# Patient Record
Sex: Female | Born: 1960 | Race: White | Hispanic: No | Marital: Married | Smoking: Never smoker
Health system: Southern US, Community
[De-identification: ages and names within clinical notes are randomized; demographics above are authoritative.]

## PROBLEM LIST (undated history)

## (undated) DIAGNOSIS — R4586 Emotional lability: Secondary | ICD-10-CM

## (undated) DIAGNOSIS — F329 Major depressive disorder, single episode, unspecified: Secondary | ICD-10-CM

## (undated) DIAGNOSIS — K633 Ulcer of intestine: Secondary | ICD-10-CM

## (undated) DIAGNOSIS — D126 Benign neoplasm of colon, unspecified: Secondary | ICD-10-CM

## (undated) DIAGNOSIS — F32A Depression, unspecified: Secondary | ICD-10-CM

## (undated) HISTORY — DX: Major depressive disorder, single episode, unspecified: F32.9

## (undated) HISTORY — DX: Depression, unspecified: F32.A

## (undated) HISTORY — DX: Ulcer of intestine: K63.3

## (undated) HISTORY — DX: Benign neoplasm of colon, unspecified: D12.6

## (undated) HISTORY — PX: FRACTURE SURGERY: SHX138

---

## 2000-11-11 ENCOUNTER — Inpatient Hospital Stay (HOSPITAL_COMMUNITY): Admission: AD | Admit: 2000-11-11 | Discharge: 2000-11-13 | Payer: Self-pay | Admitting: *Deleted

## 2000-12-27 ENCOUNTER — Other Ambulatory Visit: Admission: RE | Admit: 2000-12-27 | Discharge: 2000-12-27 | Payer: Self-pay | Admitting: *Deleted

## 2001-09-19 ENCOUNTER — Encounter: Admission: RE | Admit: 2001-09-19 | Discharge: 2001-12-18 | Payer: Self-pay | Admitting: Internal Medicine

## 2003-02-23 ENCOUNTER — Other Ambulatory Visit: Admission: RE | Admit: 2003-02-23 | Discharge: 2003-02-23 | Payer: Self-pay | Admitting: *Deleted

## 2005-11-29 ENCOUNTER — Other Ambulatory Visit: Admission: RE | Admit: 2005-11-29 | Discharge: 2005-11-29 | Payer: Self-pay | Admitting: Internal Medicine

## 2006-07-23 ENCOUNTER — Encounter: Admission: RE | Admit: 2006-07-23 | Discharge: 2006-07-23 | Payer: Self-pay | Admitting: *Deleted

## 2007-07-25 ENCOUNTER — Encounter: Admission: RE | Admit: 2007-07-25 | Discharge: 2007-07-25 | Payer: Self-pay | Admitting: *Deleted

## 2008-03-05 ENCOUNTER — Encounter: Admission: RE | Admit: 2008-03-05 | Discharge: 2008-03-05 | Payer: Self-pay | Admitting: Internal Medicine

## 2008-08-05 ENCOUNTER — Encounter: Admission: RE | Admit: 2008-08-05 | Discharge: 2008-08-05 | Payer: Self-pay | Admitting: Internal Medicine

## 2009-08-06 ENCOUNTER — Encounter: Admission: RE | Admit: 2009-08-06 | Discharge: 2009-08-06 | Payer: Self-pay | Admitting: Internal Medicine

## 2010-07-30 ENCOUNTER — Other Ambulatory Visit: Payer: Self-pay | Admitting: Internal Medicine

## 2010-07-30 DIAGNOSIS — Z1239 Encounter for other screening for malignant neoplasm of breast: Secondary | ICD-10-CM

## 2010-08-15 ENCOUNTER — Ambulatory Visit
Admission: RE | Admit: 2010-08-15 | Discharge: 2010-08-15 | Disposition: A | Discharge status: Home / Self Care | Payer: BC Managed Care – PPO | Source: Ambulatory Visit | Attending: Internal Medicine | Admitting: Internal Medicine

## 2010-08-15 DIAGNOSIS — Z1239 Encounter for other screening for malignant neoplasm of breast: Secondary | ICD-10-CM

## 2011-07-20 ENCOUNTER — Other Ambulatory Visit: Payer: Self-pay | Admitting: Family Medicine

## 2011-07-20 ENCOUNTER — Other Ambulatory Visit: Payer: Self-pay | Admitting: Physician Assistant

## 2011-07-20 DIAGNOSIS — Z1231 Encounter for screening mammogram for malignant neoplasm of breast: Secondary | ICD-10-CM

## 2011-08-23 ENCOUNTER — Ambulatory Visit
Admission: RE | Admit: 2011-08-23 | Discharge: 2011-08-23 | Disposition: A | Discharge status: Home / Self Care | Payer: BC Managed Care – PPO | Source: Ambulatory Visit | Attending: Family Medicine | Admitting: Family Medicine

## 2011-08-23 ENCOUNTER — Ambulatory Visit: Payer: BC Managed Care – PPO

## 2011-08-23 DIAGNOSIS — Z1231 Encounter for screening mammogram for malignant neoplasm of breast: Secondary | ICD-10-CM

## 2012-07-23 ENCOUNTER — Other Ambulatory Visit: Payer: Self-pay | Admitting: Family Medicine

## 2012-07-23 DIAGNOSIS — Z1231 Encounter for screening mammogram for malignant neoplasm of breast: Secondary | ICD-10-CM

## 2012-08-28 ENCOUNTER — Ambulatory Visit
Admission: RE | Admit: 2012-08-28 | Discharge: 2012-08-28 | Disposition: A | Discharge status: Home / Self Care | Payer: BC Managed Care – PPO | Source: Ambulatory Visit | Attending: Family Medicine | Admitting: Family Medicine

## 2012-12-28 ENCOUNTER — Emergency Department (HOSPITAL_COMMUNITY): Payer: BC Managed Care – PPO

## 2012-12-28 ENCOUNTER — Encounter (HOSPITAL_COMMUNITY): Payer: Self-pay | Admitting: *Deleted

## 2012-12-28 ENCOUNTER — Emergency Department (HOSPITAL_COMMUNITY)
Admission: EM | Admit: 2012-12-28 | Discharge: 2012-12-28 | Disposition: A | Discharge status: Home / Self Care | Payer: BC Managed Care – PPO | Attending: Emergency Medicine | Admitting: Emergency Medicine

## 2012-12-28 DIAGNOSIS — M533 Sacrococcygeal disorders, not elsewhere classified: Secondary | ICD-10-CM

## 2012-12-28 DIAGNOSIS — Z3202 Encounter for pregnancy test, result negative: Secondary | ICD-10-CM | POA: Insufficient documentation

## 2012-12-28 DIAGNOSIS — R110 Nausea: Secondary | ICD-10-CM | POA: Insufficient documentation

## 2012-12-28 DIAGNOSIS — M5430 Sciatica, unspecified side: Secondary | ICD-10-CM | POA: Insufficient documentation

## 2012-12-28 DIAGNOSIS — Z79899 Other long term (current) drug therapy: Secondary | ICD-10-CM | POA: Insufficient documentation

## 2012-12-28 HISTORY — DX: Emotional lability: R45.86

## 2012-12-28 LAB — COMPREHENSIVE METABOLIC PANEL
BUN: 14 mg/dL (ref 6–23)
CO2: 25 mEq/L (ref 19–32)
Calcium: 8.9 mg/dL (ref 8.4–10.5)
Creatinine, Ser: 0.79 mg/dL (ref 0.50–1.10)
GFR calc Af Amer: >90 mL/min (ref >90)
GFR calc non Af Amer: >90 mL/min (ref >90)
Glucose, Bld: 92 mg/dL (ref 70–99)

## 2012-12-28 LAB — URINALYSIS, ROUTINE W REFLEX MICROSCOPIC
Bilirubin Urine: NEGATIVE
Nitrite: NEGATIVE
Specific Gravity, Urine: 1.031 — ABNORMAL HIGH (ref 1.005–1.030)
Urobilinogen, UA: 0.2 mg/dL (ref 0.0–1.0)

## 2012-12-28 LAB — CBC WITH DIFFERENTIAL/PLATELET
Eosinophils Relative: 1 % (ref 0–5)
HCT: 36.2 % (ref 36.0–46.0)
Lymphocytes Relative: 13 % (ref 12–46)
Lymphs Abs: 1.1 10*3/uL (ref 0.7–4.0)
MCV: 83.2 fL (ref 78.0–100.0)
Monocytes Absolute: 0.6 10*3/uL (ref 0.1–1.0)
RBC: 4.35 MIL/uL (ref 3.87–5.11)
WBC: 8.7 10*3/uL (ref 4.0–10.5)

## 2012-12-28 LAB — URINE MICROSCOPIC-ADD ON

## 2012-12-28 LAB — POCT PREGNANCY, URINE: Preg Test, Ur: NEGATIVE

## 2012-12-28 MED ORDER — FENTANYL CITRATE 0.05 MG/ML IJ SOLN
50.0000 ug | Freq: Once | INTRAMUSCULAR | Status: AC
  Administered 2012-12-28: 50 ug via INTRAVENOUS
  Filled 2012-12-28: qty 2

## 2012-12-28 MED ORDER — NAPROXEN SODIUM 220 MG PO TABS: ORAL_TABLET | ORAL | Status: DC

## 2012-12-28 MED ORDER — KETOROLAC TROMETHAMINE 15 MG/ML IJ SOLN
15.0000 mg | Freq: Once | INTRAMUSCULAR | Status: AC
  Administered 2012-12-28: 15 mg via INTRAVENOUS
  Filled 2012-12-28: qty 1

## 2012-12-28 MED ORDER — OXYCODONE-ACETAMINOPHEN 5-325 MG PO TABS: 1.0000 | ORAL_TABLET | ORAL | Status: DC | PRN

## 2012-12-28 NOTE — ED Provider Notes (Addendum)
History     CSN: [id number]  Arrival date & time 12/28/12  4098   First MD Initiated Contact with Patient 12/28/12 0534      Chief Complaint  Patient presents with  . Flank Pain    (Consider location/radiation/quality/duration/timing/severity/associated sxs/prior treatment) HPI This is a 52 year old female with several days of pain in her right flank. Specifically she points to her right sacroiliac region. The pain sometimes radiates around to her right groin. She was seen prior primary care physician 2 days ago and started on hydrocodone and Skelaxin without relief. The pain is worse with movement. She has had nausea with it but no vomiting. She states the pain is severe at its worst. She has no history of nephrolithiasis. 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  Past Medical History  Diagnosis Date  . Mood change     No past surgical history on file.  History reviewed. No pertinent family history.  History  Substance Use Topics  . Smoking status: Never Smoker   . Smokeless tobacco: Not on file  . Alcohol Use: No    OB History   Grav Para Term Preterm Abortions TAB SAB Ect Mult Living                  Review of Systems  All other systems reviewed and are negative.    Allergies  Sulfa antibiotics  Home Medications   Current Outpatient Rx  Name  Route  Sig  Dispense  Refill  . Ferrous Sulfate (IRON) 28 MG TABS   Oral   Take 1 tablet by mouth daily.         Marland Kitchen FLUoxetine (PROZAC) 20 MG capsule   Oral   Take 20 mg by mouth daily.         Marland Kitchen HYDROcodone-acetaminophen (NORCO/VICODIN) 5-325 MG per tablet   Oral   Take 1 tablet by mouth every 6 (six) hours as needed for pain (pain).         Marland Kitchen ibuprofen (ADVIL,MOTRIN) 200 MG tablet   Oral   Take 800 mg by mouth every 6 (six) hours as needed for pain (pain).         . metaxalone (SKELAXIN) 800 MG tablet   Oral   Take 800 mg by mouth 3 (three) times daily.           BP 116/55  Pulse 75   Temp(Src) 98.1 F (36.7 C) (Oral)  Resp 20  Wt 138 lb (62.596 kg)  SpO2 99%  LMP 12/20/2012  Physical Exam General: Well-developed, well-nourished female in no acute distress; appearance consistent with age of record HENT: normocephalic, atraumatic Eyes: pupils equal round and reactive to light; extraocular muscles intact Neck: supple Heart: regular rate and rhythm Lungs: clear to auscultation bilaterally Abdomen: soft; nondistended; nontender; no masses or hepatosplenomegaly; bowel sounds present GU: No CVA tenderness Back: No sacroiliac tenderness; negative straight leg raise bilaterally Extremities: No deformity; full range of motion; pulses normal Neurologic: Awake, alert and oriented; motor function intact in all extremities and symmetric; no facial droop Skin: Warm and dry Psychiatric: Normal mood and affect    ED Course  Procedures (including critical care time)     MDM   Nursing notes and vitals signs, including pulse oximetry, reviewed.  Summary of this visit's results, reviewed by myself:  Labs:  Results for orders placed during the hospital encounter of 12/28/12 (from the past 24 hour(s))  CBC WITH DIFFERENTIAL     Status: Abnormal  Collection Time    12/28/12  3:37 AM      Result Value Range   WBC 8.7  4.0 - 10.5 K/uL   RBC 4.35  3.87 - 5.11 MIL/uL   Hemoglobin 12.2  12.0 - 15.0 g/dL   HCT 16.1  09.6 - 04.5 %   MCV 83.2  78.0 - 100.0 fL   MCH 28.0  26.0 - 34.0 pg   MCHC 33.7  30.0 - 36.0 g/dL   RDW 40.9  81.1 - 91.4 %   Platelets 193  150 - 400 K/uL   Neutrophils Relative % 80 (*) 43 - 77 %   Neutro Abs 7.0  1.7 - 7.7 K/uL   Lymphocytes Relative 13  12 - 46 %   Lymphs Abs 1.1  0.7 - 4.0 K/uL   Monocytes Relative 6  3 - 12 %   Monocytes Absolute 0.6  0.1 - 1.0 K/uL   Eosinophils Relative 1  0 - 5 %   Eosinophils Absolute 0.1  0.0 - 0.7 K/uL   Basophils Relative 1  0 - 1 %   Basophils Absolute 0.0  0.0 - 0.1 K/uL  COMPREHENSIVE METABOLIC PANEL      Status: Abnormal   Collection Time    12/28/12  3:37 AM      Result Value Range   Sodium 133 (*) 135 - 145 mEq/L   Potassium 3.9  3.5 - 5.1 mEq/L   Chloride 100  96 - 112 mEq/L   CO2 25  19 - 32 mEq/L   Glucose, Bld 92  70 - 99 mg/dL   BUN 14  6 - 23 mg/dL   Creatinine, Ser 7.82  0.50 - 1.10 mg/dL   Calcium 8.9  8.4 - 95.6 mg/dL   Total Protein 6.6  6.0 - 8.3 g/dL   Albumin 3.5  3.5 - 5.2 g/dL   AST 13  0 - 37 U/L   ALT 9  0 - 35 U/L   Alkaline Phosphatase 37 (*) 39 - 117 U/L   Total Bilirubin 0.3  0.3 - 1.2 mg/dL   GFR calc non Af Amer >90  >90 mL/min   GFR calc Af Amer >90  >90 mL/min  URINALYSIS, ROUTINE W REFLEX MICROSCOPIC     Status: Abnormal   Collection Time    12/28/12  3:38 AM      Result Value Range   Color, Urine YELLOW  YELLOW   APPearance CLEAR  CLEAR   Specific Gravity, Urine 1.031 (*) 1.005 - 1.030   pH 7.5  5.0 - 8.0   Glucose, UA NEGATIVE  NEGATIVE mg/dL   Hgb urine dipstick MODERATE (*) NEGATIVE   Bilirubin Urine NEGATIVE  NEGATIVE   Ketones, ur NEGATIVE  NEGATIVE mg/dL   Protein, ur NEGATIVE  NEGATIVE mg/dL   Urobilinogen, UA 0.2  0.0 - 1.0 mg/dL   Nitrite NEGATIVE  NEGATIVE   Leukocytes, UA NEGATIVE  NEGATIVE  URINE MICROSCOPIC-ADD ON     Status: Abnormal   Collection Time    12/28/12  3:38 AM      Result Value Range   Squamous Epithelial / LPF MANY (*) RARE   WBC, UA 3-6  <3 WBC/hpf   RBC / HPF 11-20  <3 RBC/hpf   Bacteria, UA FEW (*) RARE  POCT PREGNANCY, URINE     Status: None   Collection Time    12/28/12  3:44 AM      Result Value Range   Preg Test,  Ur NEGATIVE  NEGATIVE    Imaging Studies: Ct Abdomen Pelvis Wo Contrast  12/28/2012   *RADIOLOGY REPORT*  Clinical Data: Right flank pain, nausea question kidney stone  CT ABDOMEN AND PELVIS WITHOUT CONTRAST  Technique:  Multidetector CT imaging of the abdomen and pelvis was performed following the standard protocol without intravenous contrast. Sagittal and coronal MPR images reconstructed  from axial data set.  Comparison: None  Findings: Lung bases clear. Peripelvic cyst of upper pole left kidney 2.4 x 2.6 x 3.0 cm. No urinary tract calcification, hydronephrosis or ureteral dilatation. Within limits of a nonenhanced exam no focal abnormalities of the liver, spleen, pancreas, kidneys, or adrenal glands otherwise identified. Normal appendix. Unremarkable bladder and ureters.  Small cyst left ovary 2.7 x 2.5 cm image 61. Few pelvic phleboliths. Tiny amount of nonspecific free pelvic fluid. Stomach and bowel loops grossly normal appearance. No additional mass, adenopathy, free air, or hernia. Bones unremarkable.  IMPRESSION: Peripelvic cyst upper pole left kidney 3 cm greatest size. Small left ovarian cyst. Tiny amount of nonspecific free pelvic fluid potentially physiologic. Otherwise negative exam.   Original Report Authenticated By: Ulyses Southward, M.D.   6:35 AM Patient advised of CT findings. Her history is consistent with right sacroiliac disease. We will switch her from hydrocodone to oxycodone and refer her back to her primary care physician.         Hanley Seamen, MD 12/28/12 1610  Hanley Seamen, MD 12/28/12 9604

## 2012-12-28 NOTE — ED Notes (Signed)
Pt states she thinks she has kidney stone,  Right flank pain and nausea,  Pt states she is taking muscle relaxers and pain pills for supposed pulled muscle and the pain is still horrible

## 2012-12-28 NOTE — ED Notes (Signed)
POCT PREG resulted neg 

## 2012-12-30 LAB — URINE CULTURE: Colony Count: 65000

## 2013-01-01 NOTE — ED Notes (Signed)
Post ED Visit - Positive Culture Follow-up  Culture report reviewed by antimicrobial stewardship pharmacist: []  Wes Dulaney, Pharm.D., BCPS [x]  Celedonio Miyamoto, [address].D., BCPS []  Georgina Pillion, Pharm.D., BCPS []  [place], [address].D., BCPS, AAHIVP []  Estella Husk, Pharm.D., BCPS, AAHIVP  Positive urine culture  no further patient follow-up is required at this time per [facility] Nurse  Larena Sox 01/01/2013, 2:49 PM

## 2013-08-01 ENCOUNTER — Other Ambulatory Visit: Payer: Self-pay

## 2013-08-01 DIAGNOSIS — Z1231 Encounter for screening mammogram for malignant neoplasm of breast: Secondary | ICD-10-CM

## 2013-09-01 ENCOUNTER — Ambulatory Visit
Admission: RE | Admit: 2013-09-01 | Discharge: 2013-09-01 | Disposition: A | Discharge status: Home / Self Care | Payer: BC Managed Care – PPO | Source: Ambulatory Visit

## 2013-09-01 DIAGNOSIS — Z1231 Encounter for screening mammogram for malignant neoplasm of breast: Secondary | ICD-10-CM

## 2014-08-10 ENCOUNTER — Other Ambulatory Visit: Payer: Self-pay

## 2014-08-10 DIAGNOSIS — Z1231 Encounter for screening mammogram for malignant neoplasm of breast: Secondary | ICD-10-CM

## 2014-09-02 ENCOUNTER — Ambulatory Visit
Admission: RE | Admit: 2014-09-02 | Discharge: 2014-09-02 | Disposition: A | Discharge status: Home / Self Care | Payer: 59 | Source: Ambulatory Visit

## 2014-09-02 DIAGNOSIS — Z1231 Encounter for screening mammogram for malignant neoplasm of breast: Secondary | ICD-10-CM

## 2014-11-18 ENCOUNTER — Encounter: Payer: Self-pay | Admitting: Family

## 2015-01-06 ENCOUNTER — Encounter: Payer: Self-pay | Admitting: Internal Medicine

## 2015-02-11 ENCOUNTER — Encounter: Payer: Self-pay | Admitting: Gastroenterology

## 2015-03-03 ENCOUNTER — Telehealth: Payer: Self-pay

## 2015-03-03 NOTE — Telephone Encounter (Signed)
Opened encounter in error  

## 2015-03-17 ENCOUNTER — Encounter: Payer: 59 | Admitting: Internal Medicine

## 2015-04-05 ENCOUNTER — Ambulatory Visit (AMBULATORY_SURGERY_CENTER): Payer: Self-pay

## 2015-04-05 VITALS — Ht 64.0 in | Wt 140.0 lb

## 2015-04-05 DIAGNOSIS — Z1211 Encounter for screening for malignant neoplasm of colon: Secondary | ICD-10-CM

## 2015-04-05 NOTE — Progress Notes (Signed)
No allergies to eggs or soy No home oxygen No past problems with anesthesia No diet/weight loss meds  Has email.  Emmi instructions given for colonoscopy. 

## 2015-04-12 ENCOUNTER — Telehealth: Payer: Self-pay | Admitting: Gastroenterology

## 2015-04-12 DIAGNOSIS — Z1211 Encounter for screening for malignant neoplasm of colon: Secondary | ICD-10-CM

## 2015-04-13 NOTE — Telephone Encounter (Signed)
suprep called into [facility] [place] and pt called to inform.  Angela/previsit

## 2015-04-15 NOTE — Telephone Encounter (Signed)
Suprep sample left at 4th floor desk; pt called and notified.  She plans to pick it up today.  Lisa Frost/PV

## 2015-04-19 ENCOUNTER — Encounter: Payer: Self-pay | Admitting: Gastroenterology

## 2015-04-19 ENCOUNTER — Ambulatory Visit (AMBULATORY_SURGERY_CENTER): Payer: 59 | Admitting: Gastroenterology

## 2015-04-19 VITALS — BP 112/71 | HR 58 | Temp 97.7°F | Resp 16 | Ht 64.0 in | Wt 140.0 lb

## 2015-04-19 DIAGNOSIS — D124 Benign neoplasm of descending colon: Secondary | ICD-10-CM | POA: Diagnosis not present

## 2015-04-19 DIAGNOSIS — K635 Polyp of colon: Secondary | ICD-10-CM

## 2015-04-19 DIAGNOSIS — Z1211 Encounter for screening for malignant neoplasm of colon: Secondary | ICD-10-CM | POA: Diagnosis present

## 2015-04-19 DIAGNOSIS — K633 Ulcer of intestine: Secondary | ICD-10-CM | POA: Diagnosis not present

## 2015-04-19 MED ORDER — SODIUM CHLORIDE 0.9 % IV SOLN
500.0000 mL | INTRAVENOUS | Status: DC
Start: 2015-04-19 — End: 2015-04-19

## 2015-04-19 NOTE — Progress Notes (Signed)
1500patient stating she of transcient dizziness on dressing. bp taken 112/71right arm sitting in chair

## 2015-04-19 NOTE — Patient Instructions (Signed)
YOU HAD AN ENDOSCOPIC PROCEDURE TODAY AT THE [facility]:   Refer to the procedure report that was given to you for any specific questions about what was found during the examination.  If the procedure report does not answer your questions, please call your gastroenterologist to clarify.  If you requested that your care partner not be given the details of your procedure findings, then the procedure report has been included in a sealed envelope for you to review at your convenience later.  YOU SHOULD EXPECT: Some feelings of bloating in the abdomen. Passage of more gas than usual.  Walking can help get rid of the air that was put into your GI tract during the procedure and reduce the bloating. If you had a lower endoscopy (such as a colonoscopy or flexible sigmoidoscopy) you may notice spotting of blood in your stool or on the toilet paper. If you underwent a bowel prep for your procedure, you may not have a normal bowel movement for a few days.  Please Note:  You might notice some irritation and congestion in your nose or some drainage.  This is from the oxygen used during your procedure.  There is no need for concern and it should clear up in a day or so.  SYMPTOMS TO REPORT IMMEDIATELY:   Following lower endoscopy (colonoscopy or flexible sigmoidoscopy):  Excessive amounts of blood in the stool  Significant tenderness or worsening of abdominal pains  Swelling of the abdomen that is new, acute  Fever of 100F or higher   For urgent or emergent issues, a gastroenterologist can be reached at any hour by calling [phone].   DIET: Your first meal following the procedure should be a small meal and then it is ok to progress to your normal diet. Heavy or fried foods are harder to digest and may make you feel nauseous or bloated.  Likewise, meals heavy in dairy and vegetables can increase bloating.  Drink plenty of fluids but you should avoid alcoholic beverages for 24  hours.  ACTIVITY:  You should plan to take it easy for the rest of today and you should NOT DRIVE or use heavy machinery until tomorrow (because of the sedation medicines used during the test).    FOLLOW UP: Our staff will call the number listed on your records the next business day following your procedure to check on you and address any questions or concerns that you may have regarding the information given to you following your procedure. If we do not reach you, we will leave a message.  However, if you are feeling well and you are not experiencing any problems, there is no need to return our call.  We will assume that you have returned to your regular daily activities without incident.  If any biopsies were taken you will be contacted by phone or by letter within the next 1-3 weeks.  Please call us at [phone] if you have not heard about the biopsies in 3 weeks.    SIGNATURES/CONFIDENTIALITY: You and/or your care partner have signed paperwork which will be entered into your electronic medical record.  These signatures attest to the fact that that the information above on your After Visit Summary has been reviewed and is understood.  Full responsibility of the confidentiality of this discharge information lies with you and/or your care-partner. 

## 2015-04-19 NOTE — Op Note (Signed)
[place]  [address]. [place], [address]   COLONOSCOPY PROCEDURE REPORT  PATIENT: Lisa, Frost  MR#: [id number] BIRTHDATE: 08-02-1960 , 54  yrs. old GENDER: female ENDOSCOPIST: Harl Bowie, MD REFERRED IE:PPIRJJOA, Neoma Laming, NP PROCEDURE DATE:  04/19/2015 PROCEDURE:   Colonoscopy, screening and Colonoscopy with biopsy First Screening Colonoscopy - Avg.  risk and is 50 yrs.  old or older Yes.  Prior Negative Screening - Now for repeat screening. N/A  History of Adenoma - Now for follow-up colonoscopy & has been > or = to 3 yrs.  N/A  Polyps removed today? Yes ASA CLASS:   Class I INDICATIONS:Screening for colonic neoplasia and Colorectal Neoplasm Risk Assessment for this procedure is average risk. MEDICATIONS: Propofol 400 mg IV and Lidocaine 40 mg IV  DESCRIPTION OF PROCEDURE:   After the risks benefits and alternatives of the procedure were thoroughly explained, informed consent was obtained.  The digital rectal exam revealed no abnormalities of the rectum.   The LB [id number]  endoscope was introduced through the anus and advanced to the terminal ileum which was intubated for a short distance. No adverse events experienced.   The quality of the prep was adequate  The instrument was then slowly withdrawn as the colon was fully examined. Estimated blood loss is zero unless otherwise noted in this procedure report.  COLON FINDINGS:  Multiple superficial ulcers in the terminal ileum 3-4 mm in size, clean based.  Multiple random biopsies were taken from terminal ileum and colon.   The examination was otherwise normal.   A sessile polyp ranging between 3-30mm in size was found in the descending colon.  A polypectomy was performed with cold forceps.  The resection was complete, the polyp tissue was completely retrieved and sent to histology.  Retroflexed views revealed internal hemorrhoids. The time to cecum = 9.8 Withdrawal time = 23.0   The  scope was withdrawn and the procedure completed. COMPLICATIONS: There were no immediate complications.  ENDOSCOPIC IMPRESSION 1.   Multiple superficial ulcers in the terminal ileum 3-4 mm in size, clean based.  Multiple random biopsies were taken from terminal ileum and colon 2.   The examination was otherwise normal 3.   Sessile polyp ranging between 3-46mm in size was found in the descending colon; polypectomy was performed with cold forceps  RECOMMENDATIONS: 1.  Await biopsy results 2.  If the polyp(s) that was removed today is proven to be adenomatous (pre-cancerous) polyps, you will need a repeat colonoscopy in 5 years.  Otherwise you should continue to follow colorectal cancer screening guidelines for "routine risk" patients with colonoscopy in 10 years.  You will receive a letter within 1-2 weeks with the results of your biopsy as well as final recommendations.  Please call my office if you have not received a letter after 3 weeks.  eSigned:  Harl Bowie, MD 04/19/2015 2:26 PM    PATIENT NAME:  Lisa, Frost MR#: [id number]

## 2015-04-19 NOTE — Progress Notes (Signed)
Stable to RR 

## 2015-04-19 NOTE — Progress Notes (Signed)
Called to room to assist during endoscopic procedure.  Patient ID and intended procedure confirmed with present staff. Received instructions for my participation in the procedure from the performing physician.  

## 2015-04-20 ENCOUNTER — Telehealth: Payer: Self-pay | Admitting: *Deleted

## 2015-04-20 NOTE — Telephone Encounter (Signed)
[facility] if necessary on number given in admitting yesterday. Lenard Galloway RN

## 2015-04-27 ENCOUNTER — Encounter: Payer: Self-pay | Admitting: Gastroenterology

## 2015-04-29 ENCOUNTER — Other Ambulatory Visit: Payer: Self-pay

## 2015-04-29 DIAGNOSIS — K633 Ulcer of intestine: Secondary | ICD-10-CM

## 2015-05-06 ENCOUNTER — Other Ambulatory Visit: Payer: Self-pay | Admitting: *Deleted

## 2015-05-06 DIAGNOSIS — R933 Abnormal findings on diagnostic imaging of other parts of digestive tract: Secondary | ICD-10-CM

## 2015-05-13 ENCOUNTER — Ambulatory Visit: Payer: 59 | Admitting: Gastroenterology

## 2015-05-13 DIAGNOSIS — K633 Ulcer of intestine: Secondary | ICD-10-CM | POA: Diagnosis not present

## 2015-05-13 DIAGNOSIS — R197 Diarrhea, unspecified: Secondary | ICD-10-CM

## 2015-05-13 DIAGNOSIS — R933 Abnormal findings on diagnostic imaging of other parts of digestive tract: Secondary | ICD-10-CM | POA: Diagnosis not present

## 2015-05-13 NOTE — Progress Notes (Signed)
.  Patient here for capsule endoscopy. Tolerated procedure. Verbalizes understanding of written and verbal instructions. Capsule ID# [id number]  Lot [id number] exp 2017-11

## 2015-05-24 ENCOUNTER — Other Ambulatory Visit: Payer: Self-pay

## 2015-05-24 DIAGNOSIS — K2980 Duodenitis without bleeding: Secondary | ICD-10-CM

## 2015-05-28 ENCOUNTER — Other Ambulatory Visit (INDEPENDENT_AMBULATORY_CARE_PROVIDER_SITE_OTHER): Payer: 59

## 2015-05-28 DIAGNOSIS — K2980 Duodenitis without bleeding: Secondary | ICD-10-CM | POA: Diagnosis not present

## 2015-05-28 LAB — SEDIMENTATION RATE: Sed Rate: 18 mm/hr (ref 0–22)

## 2015-05-28 LAB — C-REACTIVE PROTEIN: CRP: 0.1 mg/dL — AB (ref 0.5–20.0)

## 2015-06-02 LAB — INFLAMMATORY BOWEL DISEASE-IBD
Atypical pANCA: <1:20 {titer}
SACCHAROMYCES CEREVISIAE AB: 21.5 U (ref 0.0–24.9)
Saccharomyces cerevisiae, IgA: <20 Units (ref 0.0–24.9)

## 2015-06-08 ENCOUNTER — Telehealth: Payer: Self-pay

## 2015-06-08 NOTE — Telephone Encounter (Signed)
-----   Message from Mauri Pole, MD sent at 06/07/2015  4:28 PM EST ----- Please inform patient her that one of the Ab for IBD came back equivocal. Please schedule for office visit to discuss further management. Thanks

## 2015-06-16 ENCOUNTER — Encounter: Payer: Self-pay | Admitting: Gastroenterology

## 2015-06-21 ENCOUNTER — Other Ambulatory Visit: Payer: 59

## 2015-06-21 ENCOUNTER — Ambulatory Visit (INDEPENDENT_AMBULATORY_CARE_PROVIDER_SITE_OTHER): Payer: 59 | Admitting: Physician Assistant

## 2015-06-21 ENCOUNTER — Encounter: Payer: Self-pay | Admitting: Physician Assistant

## 2015-06-21 VITALS — BP 98/66 | HR 76 | Ht 64.0 in | Wt 134.4 lb

## 2015-06-21 DIAGNOSIS — K639 Disease of intestine, unspecified: Secondary | ICD-10-CM

## 2015-06-21 DIAGNOSIS — R197 Diarrhea, unspecified: Secondary | ICD-10-CM

## 2015-06-21 DIAGNOSIS — K633 Ulcer of intestine: Secondary | ICD-10-CM

## 2015-06-21 NOTE — Progress Notes (Signed)
Patient ID: Lisa Frost, female   DOB: [birth year], 54 y.o.   MRN: [id number]   Subjective:    Patient ID: Lisa Frost, female    DOB: May 20, 1961, 54 y.o.   MRN: [id number]  HPI  Lisa Frost is a pleasant 54 year old white female recently established with Dr. Silverio Decamp. She had undergone screening colonoscopy on 04/19/2015 and was found to have multiple superficial ulcers in the terminal ileum all 3-4 mm in size and clean-based. Multiple random biopsies were taken. She also had 1 sessile polyp removed which was a tubular adenoma. Biopsies from the ileum were unremarkable and random biopsies from the colon showed no evidence of active inflammation granulomas or microscopic colitis. Patient had a sedimentation rate and CRP done, CRP 0.1 and sedimentation rate 18. This was followed by an IBD panel which was equivocal with a negative P ANCA and Saccharomyces C. Ab very minimally elevated at 21.5. Patient was asymptomatic at the time of colonoscopy. She comes in today to discuss results of capsule endoscopy which was done on 8 2016. This showed multiple scattered shallow aphthous ulcers in the distal small bowel and terminal ileum. Since finding of these results she has changed her diet and is eating less fiber less sugar less dairy and says she feels better. She says she's much less gassy than she had been before. She says previously she was noting occasional loose stools though no other symptoms. Her bowel movements have been normal. She does have an uncle with celiac disease and wonders if she has a gluten intolerance.  Family history is negative for IBD. She has not been taking any regular aspirin or NSAIDs.  Review of Systems Pertinent positive and negative review of systems were noted in the above HPI section.  All other review of systems was otherwise negative.  Outpatient Encounter Prescriptions as of 06/21/2015  Medication Sig  . FLUoxetine (PROZAC) 20 MG capsule Take 20 mg by mouth daily.  .  Probiotic Product (PROBIOTIC DAILY PO) Take by mouth. Fortify 30 billion womens probiotic  . [DISCONTINUED] ibuprofen (ADVIL,MOTRIN) 200 MG tablet Take 800 mg by mouth every 6 (six) hours as needed for pain (pain).   No facility-administered encounter medications on file as of 06/21/2015.   Allergies  Allergen Reactions  . Sulfa Antibiotics Rash   There are no active problems to display for this patient.  Social History   Social History  . Marital Status: Married    Spouse Name: N/A  . Number of Children: N/A  . Years of Education: N/A   Occupational History  . Not on file.   Social History Main Topics  . Smoking status: Never Smoker   . Smokeless tobacco: Never Used  . Alcohol Use: 0.0 oz/week    0 Standard drinks or equivalent per week     Comment: very rarely  . Drug Use: No  . Sexual Activity: Not on file   Other Topics Concern  . Not on file   Social History Narrative    Ms. Lisa Frost's family history is negative for Colon cancer.      Objective:    Filed Vitals:   06/21/15 1328  BP: 98/66  Pulse: 76    Physical Exam  well-developed white female in no acute distress, pleasant blood pressure 98/66 pulse 76 height 5 foot 4 weight 134. HEENT; nontraumatic, cephalic EOMI PERRLA sclera anicteric, Cardiovascular; regular rate and rhythm with S1-S2 no murmur or gallop, Pulmonary; clear bilaterally, Abdomen; soft nontender nondistended bowel sounds are active  there is no palpable mass or hepatosplenomegaly, Rectal; exam not done, Neuropsych ;mood and affect appropriate       Assessment & Plan:   #1 54 yo female with superficial ileal/terminal ileal ulcers on both colonoscopy and capsule endoscopy IBD panel equivocal, and pt has been asymptomatic. No NSAID use. Etiology is not clear-she may have very mild Crohns but not proven on Bx  #2 R/O celiac disease #3 Adenomatous polyp   Plan; Observation for now-discussed in detail with pt  She will continue  anti-inflammatory diet  Will plan to follow up in 4-5 months and repeat capsule endoscopy She is advised to call for advice should  she develop  any  Symptoms in the interim Check  Celiac panel   Amy Genia Harold PA-C 06/21/2015   Cc: Bernerd Limbo, MD

## 2015-06-21 NOTE — Patient Instructions (Signed)
Please go to the basement level to have your labs drawn.  Call to speak to [facility], Dr. Woodward Ku nurse to schedule capsule endoscopy.

## 2015-06-22 LAB — CELIAC PANEL 10
ENDOMYSIAL SCREEN: NEGATIVE
Gliadin IgA: 3 Units (ref <20)
Gliadin IgG: 2 Units (ref <20)
IGA: 119 mg/dL (ref 69–380)
TISSUE TRANSGLUT AB: 1 U/mL (ref <6)
TISSUE TRANSGLUTAMINASE AB, IGA: 1 U/mL (ref <4)

## 2015-06-22 NOTE — Progress Notes (Signed)
Reviewed and agree with documentation and assessment and plan. If persistent small bowel ulcers on repeat capsule endoscopy in 6 months, will have to consider repeat colonoscopy and push enteroscopy with extensive biopsies for diagnosis K. Denzil Magnuson , MD

## 2015-06-23 ENCOUNTER — Other Ambulatory Visit: Payer: Self-pay

## 2015-07-06 ENCOUNTER — Ambulatory Visit: Payer: 59 | Admitting: Gastroenterology

## 2015-07-19 NOTE — Progress Notes (Signed)
Procedure done in November, interpreted by Nicoletta Ba and Dr. Silverio Decamp. Has not yet been billed. Will send to Dr. Silverio Decamp so she can bill for her work.

## 2015-07-20 ENCOUNTER — Encounter: Payer: Self-pay | Admitting: Gastroenterology

## 2015-08-04 ENCOUNTER — Emergency Department (HOSPITAL_COMMUNITY): Payer: BLUE CROSS/BLUE SHIELD

## 2015-08-04 ENCOUNTER — Encounter (HOSPITAL_COMMUNITY): Payer: Self-pay

## 2015-08-04 ENCOUNTER — Emergency Department (HOSPITAL_COMMUNITY)
Admission: EM | Admit: 2015-08-04 | Discharge: 2015-08-04 | Disposition: A | Discharge status: Home / Self Care | Payer: BLUE CROSS/BLUE SHIELD | Attending: Emergency Medicine | Admitting: Emergency Medicine

## 2015-08-04 DIAGNOSIS — R1013 Epigastric pain: Secondary | ICD-10-CM

## 2015-08-04 DIAGNOSIS — Z8719 Personal history of other diseases of the digestive system: Secondary | ICD-10-CM | POA: Diagnosis not present

## 2015-08-04 DIAGNOSIS — Z79899 Other long term (current) drug therapy: Secondary | ICD-10-CM | POA: Diagnosis not present

## 2015-08-04 DIAGNOSIS — E860 Dehydration: Secondary | ICD-10-CM | POA: Insufficient documentation

## 2015-08-04 DIAGNOSIS — Z86010 Personal history of colonic polyps: Secondary | ICD-10-CM | POA: Diagnosis not present

## 2015-08-04 DIAGNOSIS — R1084 Generalized abdominal pain: Secondary | ICD-10-CM | POA: Diagnosis present

## 2015-08-04 DIAGNOSIS — Z8659 Personal history of other mental and behavioral disorders: Secondary | ICD-10-CM | POA: Diagnosis not present

## 2015-08-04 LAB — COMPREHENSIVE METABOLIC PANEL
ALBUMIN: 3.9 g/dL (ref 3.5–5.0)
ALK PHOS: 51 U/L (ref 38–126)
ALT: 17 U/L (ref 14–54)
ANION GAP: 9 (ref 5–15)
AST: 23 U/L (ref 15–41)
BUN: 24 mg/dL — ABNORMAL HIGH (ref 6–20)
CALCIUM: 9.3 mg/dL (ref 8.9–10.3)
CO2: 24 mmol/L (ref 22–32)
Chloride: 108 mmol/L (ref 101–111)
Creatinine, Ser: 0.76 mg/dL (ref 0.44–1.00)
GFR calc Af Amer: >60 mL/min (ref >60)
GFR calc non Af Amer: >60 mL/min (ref >60)
GLUCOSE: 107 mg/dL — AB (ref 65–99)
Potassium: 3.3 mmol/L — ABNORMAL LOW (ref 3.5–5.1)
SODIUM: 141 mmol/L (ref 135–145)
Total Bilirubin: 0.6 mg/dL (ref 0.3–1.2)
Total Protein: 6.2 g/dL — ABNORMAL LOW (ref 6.5–8.1)

## 2015-08-04 LAB — URINALYSIS, ROUTINE W REFLEX MICROSCOPIC
BILIRUBIN URINE: NEGATIVE
GLUCOSE, UA: NEGATIVE mg/dL
Ketones, ur: NEGATIVE mg/dL
Nitrite: NEGATIVE
PH: 6 (ref 5.0–8.0)
Protein, ur: NEGATIVE mg/dL
SPECIFIC GRAVITY, URINE: 1.017 (ref 1.005–1.030)

## 2015-08-04 LAB — CBC
HCT: 36 % (ref 36.0–46.0)
HEMOGLOBIN: 11.9 g/dL — AB (ref 12.0–15.0)
MCH: 29.4 pg (ref 26.0–34.0)
MCHC: 33.1 g/dL (ref 30.0–36.0)
MCV: 88.9 fL (ref 78.0–100.0)
Platelets: 184 10*3/uL (ref 150–400)
RBC: 4.05 MIL/uL (ref 3.87–5.11)
RDW: 13.4 % (ref 11.5–15.5)
WBC: 13.7 10*3/uL — ABNORMAL HIGH (ref 4.0–10.5)

## 2015-08-04 LAB — URINE MICROSCOPIC-ADD ON

## 2015-08-04 LAB — LIPASE, BLOOD: Lipase: 74 U/L — ABNORMAL HIGH (ref 11–51)

## 2015-08-04 MED ORDER — MORPHINE SULFATE (PF) 4 MG/ML IV SOLN
4.0000 mg | Freq: Once | INTRAVENOUS | Status: AC
  Administered 2015-08-04: 4 mg via INTRAVENOUS
  Filled 2015-08-04: qty 1

## 2015-08-04 MED ORDER — SODIUM CHLORIDE 0.9 % IV BOLUS (SEPSIS)
1000.0000 mL | Freq: Once | INTRAVENOUS | Status: AC
  Administered 2015-08-04: 1000 mL via INTRAVENOUS

## 2015-08-04 MED ORDER — ONDANSETRON HCL 4 MG/2ML IJ SOLN
4.0000 mg | Freq: Once | INTRAMUSCULAR | Status: AC
  Administered 2015-08-04: 4 mg via INTRAVENOUS
  Filled 2015-08-04: qty 2

## 2015-08-04 MED ORDER — IOHEXOL 300 MG/ML  SOLN
100.0000 mL | Freq: Once | INTRAMUSCULAR | Status: AC | PRN
  Administered 2015-08-04: 100 mL via INTRAVENOUS

## 2015-08-04 MED ORDER — IOHEXOL 300 MG/ML  SOLN
25.0000 mL | Freq: Once | INTRAMUSCULAR | Status: AC | PRN
  Administered 2015-08-04: 25 mL via ORAL

## 2015-08-04 NOTE — ED Notes (Signed)
Patient transported to CT 

## 2015-08-04 NOTE — Discharge Instructions (Signed)
Drink plenty of fluids and get plenty of rest.  Return to the emergency department if you develop worsening abdominal pain, high fever, bloody stool, or other new and concerning symptoms.   Abdominal Pain, Adult Many things can cause abdominal pain. Usually, abdominal pain is not caused by a disease and will improve without treatment. It can often be observed and treated at home. Your health care provider will do a physical exam and possibly order blood tests and X-rays to help determine the seriousness of your pain. However, in many cases, more time must pass before a clear cause of the pain can be found. Before that point, your health care provider may not know if you need more testing or further treatment. HOME CARE INSTRUCTIONS Monitor your abdominal pain for any changes. The following actions may help to alleviate any discomfort you are experiencing:  Only take over-the-counter or prescription medicines as directed by your health care provider.  Do not take laxatives unless directed to do so by your health care provider.  Try a clear liquid diet (broth, tea, or water) as directed by your health care provider. Slowly move to a bland diet as tolerated. SEEK MEDICAL CARE IF:  You have unexplained abdominal pain.  You have abdominal pain associated with nausea or diarrhea.  You have pain when you urinate or have a bowel movement.  You experience abdominal pain that wakes you in the night.  You have abdominal pain that is worsened or improved by eating food.  You have abdominal pain that is worsened with eating fatty foods.  You have a fever. SEEK IMMEDIATE MEDICAL CARE IF:  Your pain does not go away within 2 hours.  You keep throwing up (vomiting).  Your pain is felt only in portions of the abdomen, such as the right side or the left lower portion of the abdomen.  You pass bloody or black tarry stools. MAKE SURE YOU:  Understand these instructions.  Will watch your  condition.  Will get help right away if you are not doing well or get worse.   This information is not intended to replace advice given to you by your health care provider. Make sure you discuss any questions you have with your health care provider.   Document Released: 04/05/2005 Document Revised: 03/17/2015 Document Reviewed: 03/05/2013 Elsevier Interactive Patient Education 2016 Elsevier Inc.  Dehydration, Adult Dehydration means your body does not have as much fluid or water as it needs. It happens when you take in less fluid than you lose. Your kidneys, brain, and heart will not work properly without the right amount of fluids.  Dehydration can range from mild to severe. It should be treated right away to help prevent it from becoming severe. HOME CARE  Drink enough fluid to keep your pee (urine) clear or pale yellow.  Drink water or fluid slowly by taking small sips. You can also try sucking on ice cubes.  Have food or drinks that contain electrolytes. Examples include bananas and sports drinks.  Take over-the-counter and prescription medicines only as told by your doctor.  Prepare oral rehydration solution (ORS) according to the instructions that came with it. Take sips of ORS every 5 minutes until your pee returns to normal.  If you are throwing up (vomiting) or have watery poop (diarrhea), keep trying to drink water, ORS, or both.  If you have watery poop, avoid:  Drinks with caffeine.  Fruit juice.  Milk.  Carbonated soft drinks.  Do not take salt  tablets. This can lead to having too much sodium in your body (hypernatremia). GET HELP IF:  You cannot eat or drink without throwing up.  You have had mild watery poop for longer than 24 hours.  You have a fever. GET HELP RIGHT AWAY IF:   You have very strong thirst.  You have very bad watery poop.  You have not peed in 6-8 hours, or you have peed only a small amount of very dark pee.  You have shriveled  skin.  You are dizzy, confused, or both.   This information is not intended to replace advice given to you by your health care provider. Make sure you discuss any questions you have with your health care provider.   Document Released: 04/22/2009 Document Revised: 03/17/2015 Document Reviewed: 11/11/2014 Elsevier Interactive Patient Education [employer].

## 2015-08-04 NOTE — ED Notes (Signed)
Dr. Stark Jock made aware of pt current bp, order for additional liter of fluid

## 2015-08-04 NOTE — ED Notes (Signed)
Pt arrives via ems. Pt woke up about 1.5 hours ago c/o generalized"pressure" in her abdomen, no distention/massess/bruising/nausea or vomiting. Pt ran a 5k today and did not drink many fluids. IV established and NS 650 administered,  107/59, hr 65, 100% on room air.

## 2015-08-04 NOTE — ED Provider Notes (Signed)
CSN: [id number]     Arrival date & time 08/04/15  0203 History  By signing my name below, I, Lisa Frost, attest that this documentation has been prepared under the direction and in the presence of Veryl Speak, MD. Electronically Signed: Soijett Frost, ED Scribe. 08/04/2015. 2:30 AM.   Chief Complaint  Patient presents with  . Abdominal Pain      The history is provided by the patient. No language interpreter was used.    HPI Comments: Lisa Frost is a 55 y.o. female with a medical hx of ulcer of ileum who presents to the Emergency Department via EMS complaining of generalized abdominal pain radiating to her chest onset this evening. She denies ever having symptoms like this before and she states that the abdominal pain woke her up out of her sleep. She reports that she ran a 5k today and she didn't intake enough fluids. She notes that she has been undergoing testing for chron's disease and she has ulcers to her ileum. She states that the ulcers were found during a colonoscopy at [facility]. She notes that her last bowel movement was yesterday. She states that she has not tried any medications for the relief for her symptoms. She denies vomiting, diarrhea, dysuria, vaginal bleeding/dysuria, and any other symptoms. Denies any abdominal surgeries in the past. She states that her LMP was 1 year ago. She notes that she is an occasional alcohol drinker.     Past Medical History  Diagnosis Date  . Mood change ([place])   . Adenomatous colon polyp   . Ulcer of ileum     multiple superficial   Past Surgical History  Procedure Laterality Date  . Fracture surgery      arm, compound fx   Family History  Problem Relation Age of Onset  . Colon cancer Neg Hx    Social History  Substance Use Topics  . Smoking status: Never Smoker   . Smokeless tobacco: Never Used  . Alcohol Use: 0.0 oz/week    0 Standard drinks or equivalent per week     Comment: very rarely   OB History    No data  available     Review of Systems  A complete 10 system review of systems was obtained and all systems are negative except as noted in the HPI and PMH.   Allergies  Sulfa antibiotics  Home Medications   Prior to Admission medications   Medication Sig Start Date End Date Taking? Authorizing Provider  FLUoxetine (PROZAC) 20 MG capsule Take 20 mg by mouth daily.    Historical Provider, MD  Probiotic Product (PROBIOTIC DAILY PO) Take by mouth. Fortify 30 billion womens probiotic    Historical Provider, MD   BP 98/64 mmHg  Pulse 63  Temp(Src) 97.9 F (36.6 C) (Oral)  Resp 20  SpO2 100%  LMP 12/20/2012 Physical Exam  Constitutional: She is oriented to person, place, and time. She appears well-developed and well-nourished. No distress.  HENT:  Head: Normocephalic and atraumatic.  Right Ear: Hearing normal.  Left Ear: Hearing normal.  Nose: Nose normal.  Mouth/Throat: Oropharynx is clear and moist and mucous membranes are normal.  Eyes: Conjunctivae and EOM are normal. Pupils are equal, round, and reactive to light.  Neck: Normal range of motion. Neck supple.  Cardiovascular: Normal rate, regular rhythm, S1 normal, S2 normal and normal heart sounds.  Exam reveals no gallop and no friction rub.   No murmur heard. Pulmonary/Chest: Effort normal and breath sounds  normal. No respiratory distress. She exhibits no tenderness.  Abdominal: Soft. Normal appearance and bowel sounds are normal. There is no hepatosplenomegaly. There is no tenderness. There is no rebound, no guarding, no tenderness at McBurney's point and negative Murphy's sign. No hernia.  TTP across the entire upper abdomen, most notably in the epigastric region.   Musculoskeletal: Normal range of motion.  Neurological: She is alert and oriented to person, place, and time. She has normal strength. No cranial nerve deficit or sensory deficit. Coordination normal. GCS eye subscore is 4. GCS verbal subscore is 5. GCS motor subscore  is 6.  Skin: Skin is warm, dry and intact. No rash noted. No cyanosis.  Psychiatric: She has a normal mood and affect. Her speech is normal and behavior is normal. Thought content normal.  Nursing note and vitals reviewed.   ED Course  Procedures (including critical care time) DIAGNOSTIC STUDIES: Oxygen Saturation is 100% on RA, nl by my interpretation.    COORDINATION OF CARE: 2:28 AM Discussed treatment plan with pt at bedside which includes labs, UA, CT abdomen pelvis and pt agreed to plan.    Labs Review Labs Reviewed  CBC - Abnormal; Notable for the following:    WBC 13.7 (*)    Hemoglobin 11.9 (*)    All other components within normal limits  URINALYSIS, ROUTINE W REFLEX MICROSCOPIC (NOT AT [facility]) - Abnormal; Notable for the following:    Hgb urine dipstick MODERATE (*)    Leukocytes, UA MODERATE (*)    All other components within normal limits  URINE MICROSCOPIC-ADD ON - Abnormal; Notable for the following:    Squamous Epithelial / LPF 0-5 (*)    Bacteria, UA FEW (*)    All other components within normal limits  LIPASE, BLOOD  COMPREHENSIVE METABOLIC PANEL    Imaging Review No results found. I have personally reviewed and evaluated these images and lab results as part of my medical decision-making.   EKG Interpretation   Date/Time:  Wednesday August 04 2015 02:21:21 EST Ventricular Rate:  64 PR Interval:  138 QRS Duration: 108 QT Interval:  460 QTC Calculation: 475 R Axis:   70 Text Interpretation:  Sinus rhythm Prolonged QT Otherwise normal ecg No  priors for comparison Confirmed by Doratha Mcswain  MD, Osmani Kersten ([id number]) on 08/04/2015  2:36:43 AM      MDM   Final diagnoses:  None    Patient presents with epigastric discomfort that started approximately 2 hours prior to presentation. She reports running several miles today without much fluid intake. She is tender to palpation in the epigastrium with no rebound and no guarding. She has been hypotensive while here,  however she is informed me that her normal systolic blood pressure is 90. Her laboratory studies reveal a mild leukocytosis, but are otherwise unremarkable. CT scan of the abdomen and pelvis was obtained, however this has shown no acute pathology that would explain her symptoms. She was given 2 L of normal saline and observed for several hours. She has remained stable and her symptoms have since resolved. She remained somewhat hypotensive, however per the patient, this is her baseline.  I personally performed the services described in this documentation, which was scribed in my presence. The recorded information has been reviewed and is accurate.       Veryl Speak, MD 08/04/15 [phone]

## 2015-08-06 ENCOUNTER — Other Ambulatory Visit: Payer: Self-pay

## 2015-08-06 DIAGNOSIS — Z1231 Encounter for screening mammogram for malignant neoplasm of breast: Secondary | ICD-10-CM

## 2015-09-06 ENCOUNTER — Ambulatory Visit
Admission: RE | Admit: 2015-09-06 | Discharge: 2015-09-06 | Disposition: A | Discharge status: Home / Self Care | Payer: BLUE CROSS/BLUE SHIELD | Source: Ambulatory Visit

## 2015-09-06 DIAGNOSIS — Z1231 Encounter for screening mammogram for malignant neoplasm of breast: Secondary | ICD-10-CM

## 2015-10-14 ENCOUNTER — Telehealth: Payer: Self-pay | Admitting: Gastroenterology

## 2015-10-14 NOTE — Telephone Encounter (Signed)
Patient is ready to schedule a repeat capsule endoscopy. Okay to do this? She remains symptom free.

## 2015-10-14 NOTE — Telephone Encounter (Signed)
I have left message for the patient to call back 

## 2015-10-17 NOTE — Telephone Encounter (Signed)
Yes, please schedule- she is a pt of [facility]

## 2015-10-18 ENCOUNTER — Other Ambulatory Visit: Payer: Self-pay

## 2015-10-18 DIAGNOSIS — K633 Ulcer of intestine: Secondary | ICD-10-CM

## 2015-10-18 DIAGNOSIS — R197 Diarrhea, unspecified: Secondary | ICD-10-CM

## 2015-10-18 NOTE — Telephone Encounter (Signed)
Patient scheduled for 10/28/15 and instructed on the telephone. Written instructions mailed to her. Amb Gastro referral entered.

## 2015-10-28 ENCOUNTER — Ambulatory Visit (INDEPENDENT_AMBULATORY_CARE_PROVIDER_SITE_OTHER): Payer: BLUE CROSS/BLUE SHIELD | Admitting: Gastroenterology

## 2015-10-28 DIAGNOSIS — K639 Disease of intestine, unspecified: Secondary | ICD-10-CM | POA: Diagnosis not present

## 2015-10-28 DIAGNOSIS — K633 Ulcer of intestine: Secondary | ICD-10-CM

## 2015-10-28 NOTE — Progress Notes (Signed)
Pt tolerated procedure well, verbalized all written and verbal instructions  Lot # [id number] 08/19/2016 Capsule ID # [id number]

## 2015-11-04 ENCOUNTER — Telehealth: Payer: Self-pay

## 2015-11-04 NOTE — Telephone Encounter (Signed)
-----   Message from Barron Alvine, RN sent at 10/28/2015  8:56 AM EDT ----- Check on pt to see if capsule passed, if not get xray

## 2015-11-04 NOTE — Telephone Encounter (Signed)
Left message on machine to call back  

## 2015-11-05 ENCOUNTER — Encounter: Payer: Self-pay | Admitting: Gastroenterology

## 2015-11-08 NOTE — Telephone Encounter (Signed)
The pt is not sure if she passed the capsule or not but she does not want to have an xray, she states she feels fine please advise

## 2015-11-09 NOTE — Telephone Encounter (Signed)
Capsule made it to her colon so no worries, doesn't need a KUB

## 2015-11-09 NOTE — Telephone Encounter (Signed)
KUB order has been cancelled.

## 2016-04-20 ENCOUNTER — Telehealth: Payer: Self-pay

## 2016-04-20 NOTE — Telephone Encounter (Signed)
Received a message form the patient requesting a call back from the nurse. Called back to the patient. Left a voicemail.

## 2016-04-24 NOTE — Telephone Encounter (Signed)
Patient left me a voicemail saying she is ready to schedule another capsule study. It isn't really clear to me when she is due a recall. She says it was to be a repeat capsule. Please advise.

## 2016-04-25 ENCOUNTER — Other Ambulatory Visit: Payer: Self-pay

## 2016-04-25 DIAGNOSIS — K633 Ulcer of intestine: Secondary | ICD-10-CM

## 2016-04-25 NOTE — Telephone Encounter (Signed)
She had aphthous ulcers in her small bowel and wanted to repeat it in about 6-12 months to see if they have resolved. Biopsies or serology was not diagnostic of Crohn's and she is asymptomatic. Ok to schedule capsule small bowel endoscopy for follow up of small bowel ulcers

## 2016-04-25 NOTE — Telephone Encounter (Signed)
Scheduled with the patient for her study to be done 05/08/16. Amb GI referral entered. Instructions mailed. The patient has had this study this year. She states she is comfortable with this.

## 2016-06-20 ENCOUNTER — Other Ambulatory Visit: Payer: Self-pay

## 2016-06-20 ENCOUNTER — Telehealth: Payer: Self-pay

## 2016-06-20 DIAGNOSIS — D508 Other iron deficiency anemias: Secondary | ICD-10-CM

## 2016-06-20 DIAGNOSIS — K633 Ulcer of intestine: Secondary | ICD-10-CM

## 2016-06-20 NOTE — Telephone Encounter (Signed)
A repeat capsule study was denied. The patient asks what should be done now. She remains symptom free at this time. Thanks

## 2016-06-20 NOTE — Telephone Encounter (Signed)
I feel repeat colonoscopy is invasive and unnecessary given she is symptom free. Follow up CBC and ferritin annually

## 2016-06-20 NOTE — Telephone Encounter (Signed)
She had normal ESR and CRP a year ago, and we can repeat it

## 2016-06-20 NOTE — Telephone Encounter (Signed)
She asks if she can have inflammatory marker testing?

## 2016-06-27 ENCOUNTER — Other Ambulatory Visit (INDEPENDENT_AMBULATORY_CARE_PROVIDER_SITE_OTHER): Payer: BLUE CROSS/BLUE SHIELD

## 2016-06-27 DIAGNOSIS — K639 Disease of intestine, unspecified: Secondary | ICD-10-CM | POA: Diagnosis not present

## 2016-06-27 DIAGNOSIS — D508 Other iron deficiency anemias: Secondary | ICD-10-CM

## 2016-06-27 DIAGNOSIS — K633 Ulcer of intestine: Secondary | ICD-10-CM

## 2016-06-27 LAB — CBC WITH DIFFERENTIAL/PLATELET
BASOS ABS: 0 10*3/uL (ref 0.0–0.1)
Basophils Relative: 0.5 % (ref 0.0–3.0)
EOS PCT: 1.3 % (ref 0.0–5.0)
Eosinophils Absolute: 0.1 10*3/uL (ref 0.0–0.7)
HCT: 36.9 % (ref 36.0–46.0)
Hemoglobin: 12.7 g/dL (ref 12.0–15.0)
LYMPHS ABS: 1.6 10*3/uL (ref 0.7–4.0)
Lymphocytes Relative: 19.9 % (ref 12.0–46.0)
MCHC: 34.3 g/dL (ref 30.0–36.0)
MCV: 86.7 fl (ref 78.0–100.0)
MONO ABS: 0.5 10*3/uL (ref 0.1–1.0)
MONOS PCT: 6.3 % (ref 3.0–12.0)
NEUTROS ABS: 5.9 10*3/uL (ref 1.4–7.7)
NEUTROS PCT: 72 % (ref 43.0–77.0)
PLATELETS: 205 10*3/uL (ref 150.0–400.0)
RBC: 4.26 Mil/uL (ref 3.87–5.11)
RDW: 13.8 % (ref 11.5–15.5)
WBC: 8.2 10*3/uL (ref 4.0–10.5)

## 2016-06-27 LAB — SEDIMENTATION RATE: Sed Rate: 10 mm/hr (ref 0–30)

## 2016-06-27 LAB — FERRITIN: Ferritin: 47.2 ng/mL (ref 10.0–291.0)

## 2016-06-27 LAB — C-REACTIVE PROTEIN: CRP: 0.1 mg/dL — ABNORMAL LOW (ref 0.5–20.0)

## 2016-08-14 ENCOUNTER — Other Ambulatory Visit: Payer: Self-pay | Admitting: Family

## 2016-08-14 DIAGNOSIS — Z1231 Encounter for screening mammogram for malignant neoplasm of breast: Secondary | ICD-10-CM

## 2016-08-23 ENCOUNTER — Telehealth: Payer: Self-pay

## 2016-08-23 ENCOUNTER — Other Ambulatory Visit: Payer: Self-pay

## 2016-08-23 DIAGNOSIS — A09 Infectious gastroenteritis and colitis, unspecified: Secondary | ICD-10-CM

## 2016-08-23 NOTE — Telephone Encounter (Signed)
Patient agrees to this plan. °

## 2016-08-23 NOTE — Telephone Encounter (Signed)
Please advise patient to GI pathogen panel, c.diff.

## 2016-08-23 NOTE — Telephone Encounter (Signed)
Patient calling in regarding this. Best # [phone]

## 2016-08-23 NOTE — Telephone Encounter (Signed)
Patient has had 2 weeks of diarrhea until today. Before today, she was having 7 to 8 loose stools daily. She changed to a liquid diet and then advanced. Denies bloody stools, abdominal pain, nausea or fever. She is VERY concerned that she may have a flare for the first time. Should I bring her in? Stool is normal today.

## 2016-08-24 ENCOUNTER — Other Ambulatory Visit: Payer: Self-pay

## 2016-08-24 ENCOUNTER — Other Ambulatory Visit (INDEPENDENT_AMBULATORY_CARE_PROVIDER_SITE_OTHER): Payer: BLUE CROSS/BLUE SHIELD

## 2016-08-24 DIAGNOSIS — A09 Infectious gastroenteritis and colitis, unspecified: Secondary | ICD-10-CM | POA: Diagnosis not present

## 2016-08-24 LAB — CBC WITH DIFFERENTIAL/PLATELET
BASOS ABS: 0.1 10*3/uL (ref 0.0–0.1)
Basophils Relative: 0.9 % (ref 0.0–3.0)
Eosinophils Absolute: 0.1 10*3/uL (ref 0.0–0.7)
Eosinophils Relative: 1.1 % (ref 0.0–5.0)
HCT: 36.7 % (ref 36.0–46.0)
HEMOGLOBIN: 12.6 g/dL (ref 12.0–15.0)
LYMPHS ABS: 1.7 10*3/uL (ref 0.7–4.0)
Lymphocytes Relative: 19.9 % (ref 12.0–46.0)
MCHC: 34.5 g/dL (ref 30.0–36.0)
MCV: 87.9 fl (ref 78.0–100.0)
MONOS PCT: 6.1 % (ref 3.0–12.0)
Monocytes Absolute: 0.5 10*3/uL (ref 0.1–1.0)
NEUTROS PCT: 72 % (ref 43.0–77.0)
Neutro Abs: 6.2 10*3/uL (ref 1.4–7.7)
Platelets: 211 10*3/uL (ref 150.0–400.0)
RBC: 4.17 Mil/uL (ref 3.87–5.11)
RDW: 13.7 % (ref 11.5–15.5)
WBC: 8.6 10*3/uL (ref 4.0–10.5)

## 2016-08-25 ENCOUNTER — Ambulatory Visit: Payer: BLUE CROSS/BLUE SHIELD | Admitting: Gastroenterology

## 2016-09-14 ENCOUNTER — Ambulatory Visit
Admission: RE | Admit: 2016-09-14 | Discharge: 2016-09-14 | Disposition: A | Discharge status: Home / Self Care | Payer: BLUE CROSS/BLUE SHIELD | Source: Ambulatory Visit | Attending: Family | Admitting: Family

## 2016-09-14 DIAGNOSIS — Z1231 Encounter for screening mammogram for malignant neoplasm of breast: Secondary | ICD-10-CM

## 2017-03-09 ENCOUNTER — Telehealth: Payer: Self-pay | Admitting: Gastroenterology

## 2017-03-09 NOTE — Telephone Encounter (Signed)
Please schedule office visit to discuss. She has seen Amy Esterwood in the past. Unclear if repeat small bowel video capsule will provide any additional information than that we already know.  May consider based on her symptoms or any change in labs.

## 2017-03-13 NOTE — Telephone Encounter (Signed)
Discussed with the patient. She is symptom free. She would like to see Nicoletta Ba PA-C to discuss this sooner than November. Appointment made for 03/19/17 with Nicoletta Ba, PA-C

## 2017-03-19 ENCOUNTER — Encounter: Payer: Self-pay | Admitting: Physician Assistant

## 2017-03-19 ENCOUNTER — Ambulatory Visit (INDEPENDENT_AMBULATORY_CARE_PROVIDER_SITE_OTHER): Payer: BLUE CROSS/BLUE SHIELD | Admitting: Physician Assistant

## 2017-03-19 VITALS — BP 102/68 | Ht 64.0 in | Wt 143.0 lb

## 2017-03-19 DIAGNOSIS — K529 Noninfective gastroenteritis and colitis, unspecified: Secondary | ICD-10-CM | POA: Diagnosis not present

## 2017-03-19 DIAGNOSIS — Z86010 Personal history of colonic polyps: Secondary | ICD-10-CM

## 2017-03-19 NOTE — Patient Instructions (Signed)
You have been scheduled for a colonoscopy. Please follow written instructions given to you at your visit today.  If you use inhalers (even only as needed), please bring them with you on the day of your procedure. Your physician has requested that you go to www.startemmi.com and enter the access code given to you at your visit today. This web site gives a general overview about your procedure. However, you should still follow specific instructions given to you by our office regarding your preparation for the procedure.  We have provided you with the colonoscopy prep sample.    If you are age 55 or younger, your body mass index should be between 19-25. Your Body mass index is 24.55 kg/m. If this is out of the aformentioned range listed, please consider follow up with your Primary Care Provider.

## 2017-03-19 NOTE — Progress Notes (Signed)
Subjective:    Patient ID: Lisa Frost, female    DOB: Mar 13, 1961, 56 y.o.   MRN: [id number]  HPI Lisa Frost  is a pleasant 56 year old white female, known to Dr. Silverio Decamp and myself. She had initially been seen for screening colonoscopy in October 2016 and was found to have multiple superficial ulcers in the terminal ileum all 3-4 mm in size and clean-based. Biopsies from the ileum were unremarkable, she also had random biopsies from the colon done which were negative and had 1 sessile polyp removed which was a tubular adenoma. Inflammatory markers were done to follow and were negative and then an IBD panel was done which was equivocal with a negative p-ANCA and 8 minimally elevated Saccharomyces antibody at 21.5 Patient has been asymptomatic. She underwent capsule endoscopy in August 2016 which also showed multiple scattered shallow aphthous ulcerations in the terminal ileum/distal small bowel. Celiac panel was done and negative. She had repeat capsule endoscopy done in April 2017 again showing multiple superficial distal small bowel ulcers and erosions. She comes in today to discuss repeat capsule endoscopy versus colonoscopy. Again she remains completely asymptomatic and says she feels very good. She has absolutely no complaints of abdominal pain cramping bloating or loose stools. She says she has been very good about following and autoimmune and anti-inflammatory diet which is very low sugar and low grain.. She did have 1 episode of diarrhea early this spring which lasted for few days and then resolved and has not recurred. She relates having recent physical done by her PCP with normal labs.  Review of Systems Pertinent positive and negative review of systems were noted in the above HPI section.  All other review of systems was otherwise negative.  Outpatient Encounter Prescriptions as of 03/19/2017  Medication Sig  . Cholecalciferol (VITAMIN D3) 1000 units CAPS Take by mouth.  Marland Kitchen FLUoxetine  (PROZAC) 20 MG capsule Take 20 mg by mouth daily.  . Magnesium 250 MG TABS Take by mouth.  . Multiple Vitamin (MULTIVITAMIN) capsule Take 1 capsule by mouth daily.  . Omega-3 1000 MG CAPS Take by mouth.  . Probiotic Product (PROBIOTIC-10 PO) Take by mouth.  . thiamine (VITAMIN B-1) 50 MG tablet Take 50 mg by mouth daily.  . TURMERIC CURCUMIN PO Take by mouth.  . vitamin C (ASCORBIC ACID) 250 MG tablet Take 250 mg by mouth daily.  . Zinc Sulfate (ZINC 15 PO) Take by mouth.   No facility-administered encounter medications on file as of 03/19/2017.    Allergies  Allergen Reactions  . Sulfa Antibiotics Rash   There are no active problems to display for this patient.  Social History   Social History  . Marital status: Married    Spouse name: N/A  . Number of children: N/A  . Years of education: N/A   Occupational History  . Not on file.   Social History Main Topics  . Smoking status: Never Smoker  . Smokeless tobacco: Never Used  . Alcohol use 0.0 oz/week     Comment: very rarely  . Drug use: No  . Sexual activity: Not on file   Other Topics Concern  . Not on file   Social History Narrative  . No narrative on file    Ms. Dechellis's family history includes Breast cancer in her paternal aunt.      Objective:    Vitals:   03/19/17 1337  BP: 102/68    Physical Exam well-developed white female in no acute distress, pleasant blood  pressure 102/68, height 5 foot 4, weight 143, BMI 24.5. HEENT; nontraumatic normocephalic EOMI PERRLA sclera anicteric, Cardiovascular; regular rate and rhythm with S1-S2 no murmur rub or gallop, Pulmonary; clear bilaterally, Abdomen; soft, nontender nondistended bowel sounds are active there is no palpable mass or hepatosplenomegaly on Rectal ;exam not done, Extremities ;no clubbing cyanosis or edema skin warm and dry, Neuropsych ;mood and affect appropriate       Assessment & Plan:   #41 56 year old white female with both colonoscopy and 2  previous capsule endoscopies are showing multiple aphthous ulcerations in the terminal ileum. Previous IBD panel was equivocal, celiac markers negative and inflammatory markers have been negative in the past. 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 Unfortunately previous ileal biopsies were nondiagnostic.  #2 history of tubular adenomatous polyp, colonoscopy October 2016  Plan; I think repeat colonoscopy with intubation of the terminal ileum and and repeat ileal biopsies is indicated for diagnosis and to help guide future management. Patient will be scheduled for colonoscopy with Dr. Silverio Decamp. Procedure discussed in detail with patient including risks benefits and she is agreeable to proceed. Further plans pending results of above. She will be reluctant to take any immunosuppressant medication as long as she is asymptomatic.  Annelyse Rey S Banner Huckaba PA-C 03/19/2017   Cc: Bernerd Limbo, MD

## 2017-03-27 NOTE — Progress Notes (Signed)
Reviewed and agree with documentation and assessment and plan. K. Veena Aiyden Lauderback , MD   

## 2017-05-03 ENCOUNTER — Encounter: Payer: Self-pay | Admitting: Gastroenterology

## 2017-05-17 ENCOUNTER — Ambulatory Visit (AMBULATORY_SURGERY_CENTER): Payer: BLUE CROSS/BLUE SHIELD | Admitting: Gastroenterology

## 2017-05-17 ENCOUNTER — Encounter: Payer: Self-pay | Admitting: Gastroenterology

## 2017-05-17 ENCOUNTER — Other Ambulatory Visit: Payer: Self-pay

## 2017-05-17 VITALS — BP 101/54 | HR 91 | Temp 98.4°F | Resp 11 | Ht 64.0 in | Wt 143.0 lb

## 2017-05-17 DIAGNOSIS — K639 Disease of intestine, unspecified: Secondary | ICD-10-CM | POA: Diagnosis not present

## 2017-05-17 DIAGNOSIS — Z86010 Personal history of colonic polyps: Secondary | ICD-10-CM | POA: Diagnosis present

## 2017-05-17 DIAGNOSIS — K633 Ulcer of intestine: Secondary | ICD-10-CM

## 2017-05-17 MED ORDER — SODIUM CHLORIDE 0.9 % IV SOLN: 500.0000 mL | INTRAVENOUS | Status: AC

## 2017-05-17 NOTE — Op Note (Signed)
[facility] Patient Name: Lisa Frost Procedure Date: 05/17/2017 1:34 PM MRN: [id number] Endoscopist: Mauri Pole , MD Age: 56 Referring MD:  Date of Birth: 07-Dec-1960 Gender: Female Account #: [id number] Procedure:                Colonoscopy Indications:              Obtain more precise diagnosis of inflammatory bowel                            disease. History colon tubular adenoma. Medicines:                Monitored Anesthesia Care Procedure:                Pre-Anesthesia Assessment:                           - Prior to the procedure, a History and Physical                            was performed, and patient medications and                            allergies were reviewed. The patient's tolerance of                            previous anesthesia was also reviewed. The risks                            and benefits of the procedure and the sedation                            options and risks were discussed with the patient.                            All questions were answered, and informed consent                            was obtained. Prior Anticoagulants: The patient has                            taken no previous anticoagulant or antiplatelet                            agents. ASA Grade Assessment: I - A normal, healthy                            patient. After reviewing the risks and benefits,                            the patient was deemed in satisfactory condition to                            undergo the procedure.  After obtaining informed consent, the colonoscope                            was passed under direct vision. Throughout the                            procedure, the patient's blood pressure, pulse, and                            oxygen saturations were monitored continuously. The                            Colonoscope was introduced through the anus and                            advanced to the the  terminal ileum, with                            identification of the appendiceal orifice and IC                            valve. The colonoscopy was performed without                            difficulty. The patient tolerated the procedure                            well. The quality of the bowel preparation was                            adequate. The terminal ileum, ileocecal valve,                            appendiceal orifice, and rectum were photographed. Scope In: 1:38:47 PM Scope Out: 2:00:32 PM Scope Withdrawal Time: 0 hours 16 minutes 49 seconds  Total Procedure Duration: 0 hours 21 minutes 45 seconds  Findings:                 The perianal and digital rectal examinations were                            normal.                           The terminal ileum contained a single localized                            non-bleeding erosion. Biopsies were taken with a                            cold forceps for histology.                           The exam was otherwise without abnormality on  direct and retroflexion views. Complications:            No immediate complications. Estimated Blood Loss:     Estimated blood loss was minimal. Impression:               - A single erosion in the terminal ileum. Biopsied.                           - The examination was otherwise normal on direct                            and retroflexion views. Recommendation:           - Patient has a contact number available for                            emergencies. The signs and symptoms of potential                            delayed complications were discussed with the                            patient. Return to normal activities tomorrow.                            Written discharge instructions were provided to the                            patient.                           - Resume previous diet.                           - Continue present medications.                            - Await pathology results.                           - Repeat colonoscopy in 5 years for surveillance                            based on pathology results.                           - Return to GI clinic in 1 year. Mauri Pole, MD 05/17/2017 2:07:12 PM This report has been signed electronically.

## 2017-05-17 NOTE — Progress Notes (Signed)
Called to room to assist during endoscopic procedure.  Patient ID and intended procedure confirmed with present staff. Received instructions for my participation in the procedure from the performing physician.  

## 2017-05-17 NOTE — Progress Notes (Signed)
Report given to PACU, vss 

## 2017-05-17 NOTE — Patient Instructions (Signed)
YOU HAD AN ENDOSCOPIC PROCEDURE TODAY AT [facility]:   Refer to the procedure report that was given to you for any specific questions about what was found during the examination.  If the procedure report does not answer your questions, please call your gastroenterologist to clarify.  If you requested that your care partner not be given the details of your procedure findings, then the procedure report has been included in a sealed envelope for you to review at your convenience later.  YOU SHOULD EXPECT: Some feelings of bloating in the abdomen. Passage of more gas than usual.  Walking can help get rid of the air that was put into your GI tract during the procedure and reduce the bloating. If you had a lower endoscopy (such as a colonoscopy or flexible sigmoidoscopy) you may notice spotting of blood in your stool or on the toilet paper. If you underwent a bowel prep for your procedure, you may not have a normal bowel movement for a few days.  Please Note:  You might notice some irritation and congestion in your nose or some drainage.  This is from the oxygen used during your procedure.  There is no need for concern and it should clear up in a day or so.  SYMPTOMS TO REPORT IMMEDIATELY:   Following lower endoscopy (colonoscopy or flexible sigmoidoscopy):  Excessive amounts of blood in the stool  Significant tenderness or worsening of abdominal pains  Swelling of the abdomen that is new, acute  Fever of 100F or higher  For urgent or emergent issues, a gastroenterologist can be reached at any hour by calling [phone].   DIET:  We do recommend a small meal at first, but then you may proceed to your regular diet.  Drink plenty of fluids but you should avoid alcoholic beverages for 24 hours.  MEDICATIONS: Continue present medications.  Follow up in GI clinic in one year.  ACTIVITY:  You should plan to take it easy for the rest of today and you should NOT DRIVE or use heavy  machinery until tomorrow (because of the sedation medicines used during the test).    FOLLOW UP: Our staff will call the number listed on your records the next business day following your procedure to check on you and address any questions or concerns that you may have regarding the information given to you following your procedure. If we do not reach you, we will leave a message.  However, if you are feeling well and you are not experiencing any problems, there is no need to return our call.  We will assume that you have returned to your regular daily activities without incident.  If any biopsies were taken you will be contacted by phone or by letter within the next 1-3 weeks.  Please call us at [phone] if you have not heard about the biopsies in 3 weeks.   Thank you for allowing [place] to provide for your healthcare needs today.  SIGNATURES/CONFIDENTIALITY: You and/or your care partner have signed paperwork which will be entered into your electronic medical record.  These signatures attest to the fact that that the information above on your After Visit Summary has been reviewed and is understood.  Full responsibility of the confidentiality of this discharge information lies with you and/or your care-partner.

## 2017-05-18 ENCOUNTER — Telehealth: Payer: Self-pay | Admitting: *Deleted

## 2017-05-18 NOTE — Telephone Encounter (Signed)
  Follow up Call-  Call back number 05/17/2017 04/19/2015  Post procedure Call Back phone  # [phone]  Permission to leave phone message Yes Yes  Some recent data might be hidden     Patient questions:  Do you have a fever, pain , or abdominal swelling? No. Pain Score  0 *  Have you tolerated food without any problems? Yes.    Have you been able to return to your normal activities? Yes.    Do you have any questions about your discharge instructions: Diet   No. Medications  No. Follow up visit  No.  Do you have questions or concerns about your Care? No.  Actions: * If pain score is 4 or above: No action needed, pain <4.

## 2017-05-18 NOTE — Telephone Encounter (Signed)
No answer. Name identifier. Message left to call if questions or concerns and we would attempt to call again later in the day.

## 2017-05-18 NOTE — Telephone Encounter (Deleted)
  Follow up Call-  Call back number 05/17/2017 04/19/2015  Post procedure Call Back phone  # [phone]  Permission to leave phone message Yes Yes  Some recent data might be hidden     Patient questions:  Do you have a fever, pain , or abdominal swelling? {yes no:314532} Pain Score  {NUMBERS; 0-10:5044} *  Have you tolerated food without any problems? {yes no:314532}  Have you been able to return to your normal activities? {yes no:314532}  Do you have any questions about your discharge instructions: Diet   {yes no:314532} Medications  {yes no:314532} Follow up visit  {yes no:314532}  Do you have questions or concerns about your Care? {yes no:314532}  Actions: * If pain score is 4 or above: {ACTION; [facility] ENDO PAIN >4:21563::"No action needed, pain <4."}

## 2017-05-22 ENCOUNTER — Encounter: Payer: Self-pay | Admitting: Gastroenterology

## 2017-08-07 ENCOUNTER — Other Ambulatory Visit: Payer: Self-pay | Admitting: Family

## 2017-08-07 DIAGNOSIS — Z139 Encounter for screening, unspecified: Secondary | ICD-10-CM

## 2017-09-17 ENCOUNTER — Ambulatory Visit
Admission: RE | Admit: 2017-09-17 | Discharge: 2017-09-17 | Disposition: A | Discharge status: Home / Self Care | Payer: BLUE CROSS/BLUE SHIELD | Source: Ambulatory Visit | Attending: Family | Admitting: Family

## 2017-09-17 DIAGNOSIS — Z139 Encounter for screening, unspecified: Secondary | ICD-10-CM

## 2018-08-15 ENCOUNTER — Other Ambulatory Visit: Payer: Self-pay | Admitting: Nurse Practitioner

## 2018-08-15 DIAGNOSIS — Z1231 Encounter for screening mammogram for malignant neoplasm of breast: Secondary | ICD-10-CM

## 2018-09-30 ENCOUNTER — Ambulatory Visit: Payer: BLUE CROSS/BLUE SHIELD

## 2018-11-15 ENCOUNTER — Ambulatory Visit: Payer: BLUE CROSS/BLUE SHIELD

## 2019-01-13 ENCOUNTER — Ambulatory Visit: Payer: BLUE CROSS/BLUE SHIELD

## 2019-01-30 ENCOUNTER — Ambulatory Visit: Payer: BLUE CROSS/BLUE SHIELD

## 2020-01-17 IMAGING — MG DIGITAL SCREENING BILATERAL MAMMOGRAM WITH TOMO AND CAD
8 series · 9 of 24 positions shown · non-contrast
Comparison: Previous exam(s).

CLINICAL DATA: Screening.

EXAM:
DIGITAL SCREENING BILATERAL MAMMOGRAM WITH TOMO AND CAD

[R CC synth-2D]
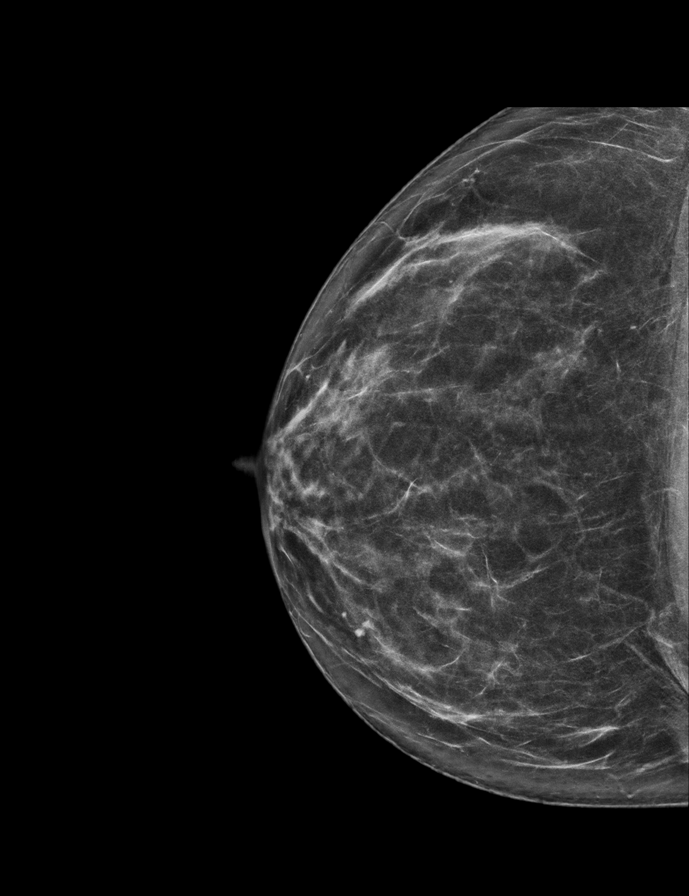

[L CC synth-2D]
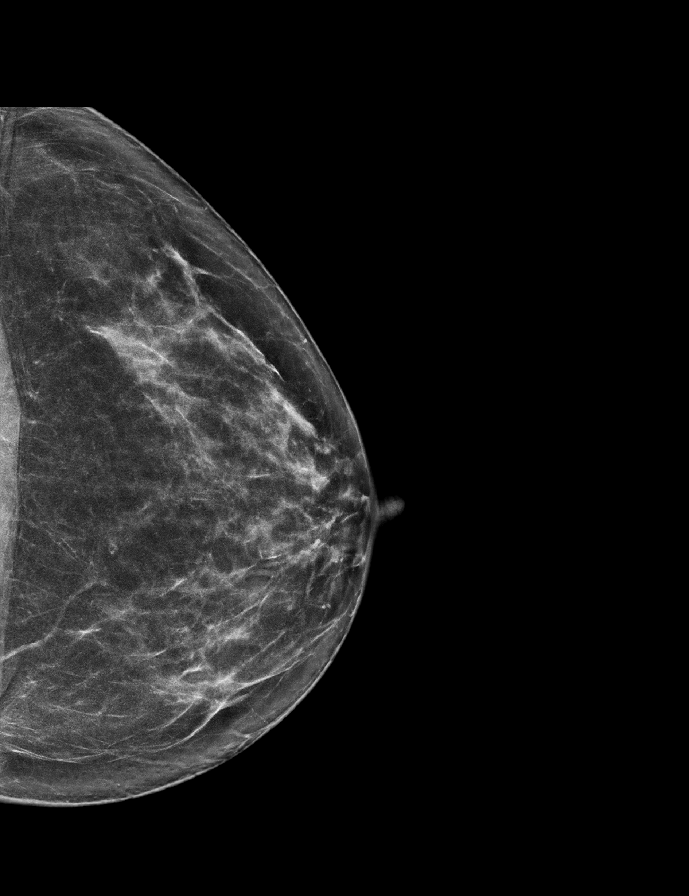

[R MLO synth-2D]
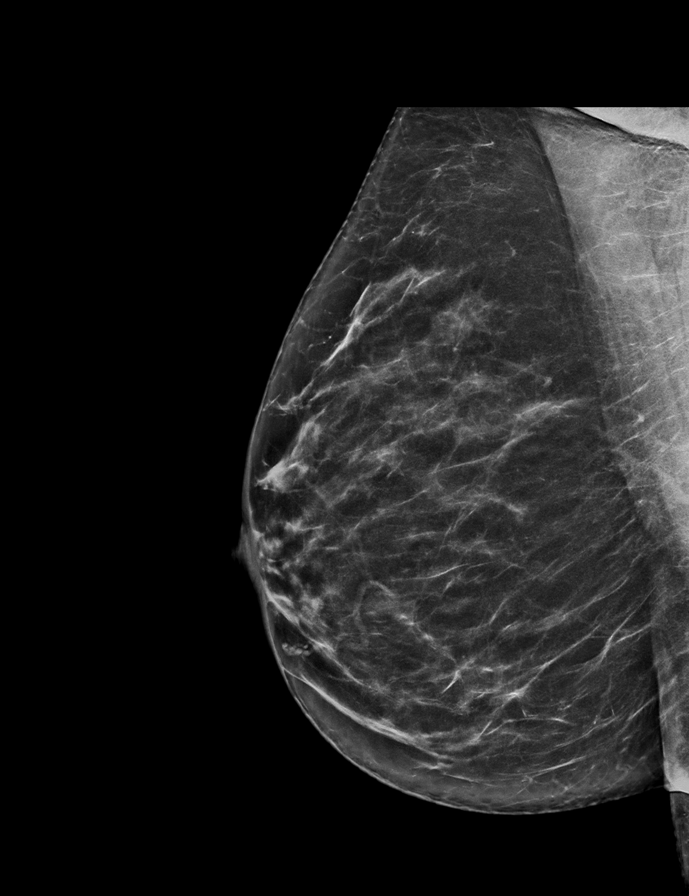

[L MLO synth-2D]
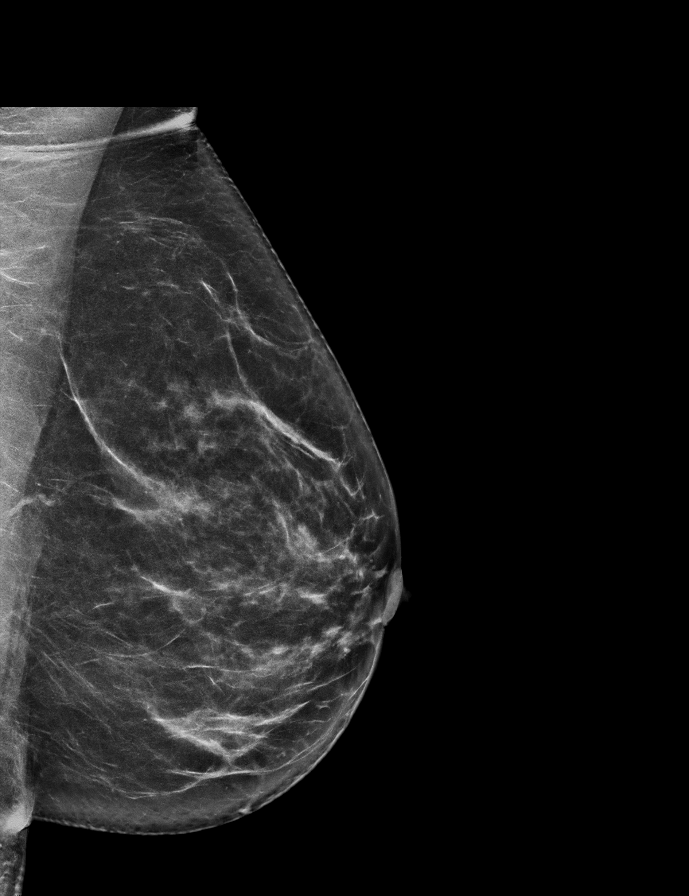

[L CC tomo · 2 of 64 frames shown]
[frame 21/64]
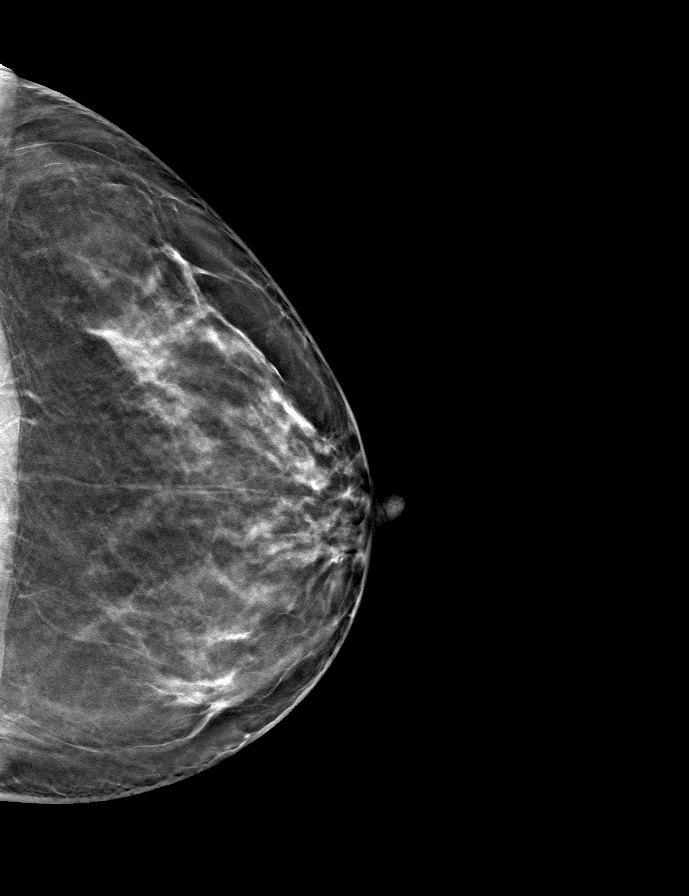
[frame 33/64]
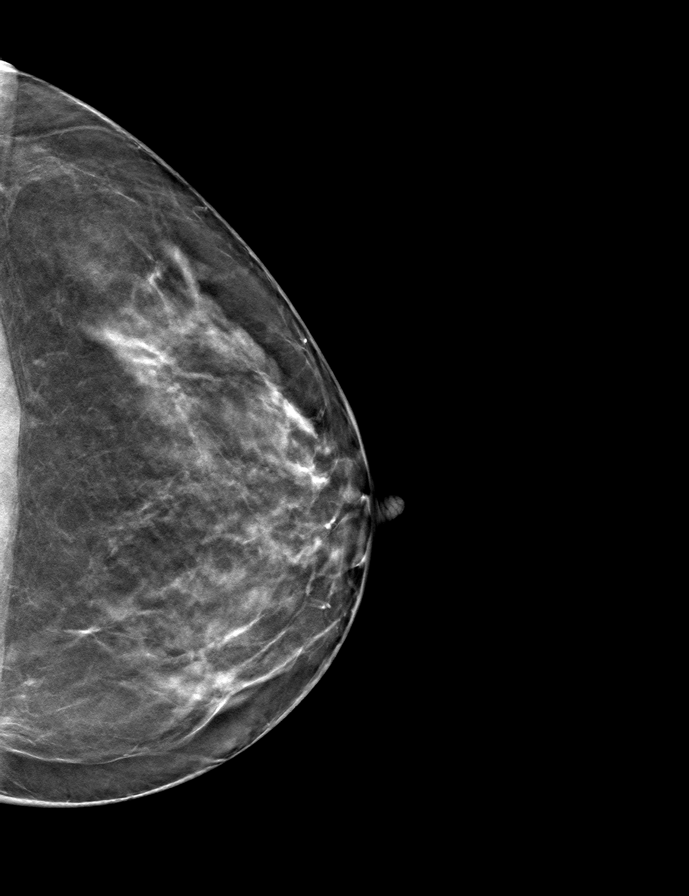

[L MLO tomo · tomo slice 34/67.0]
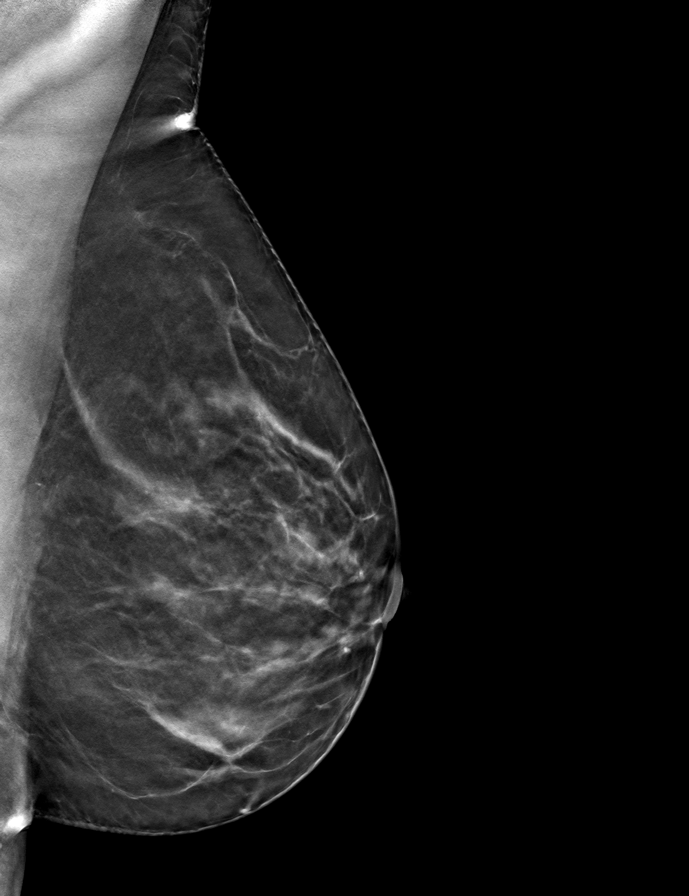

[R MLO tomo · tomo slice 35/70.0]
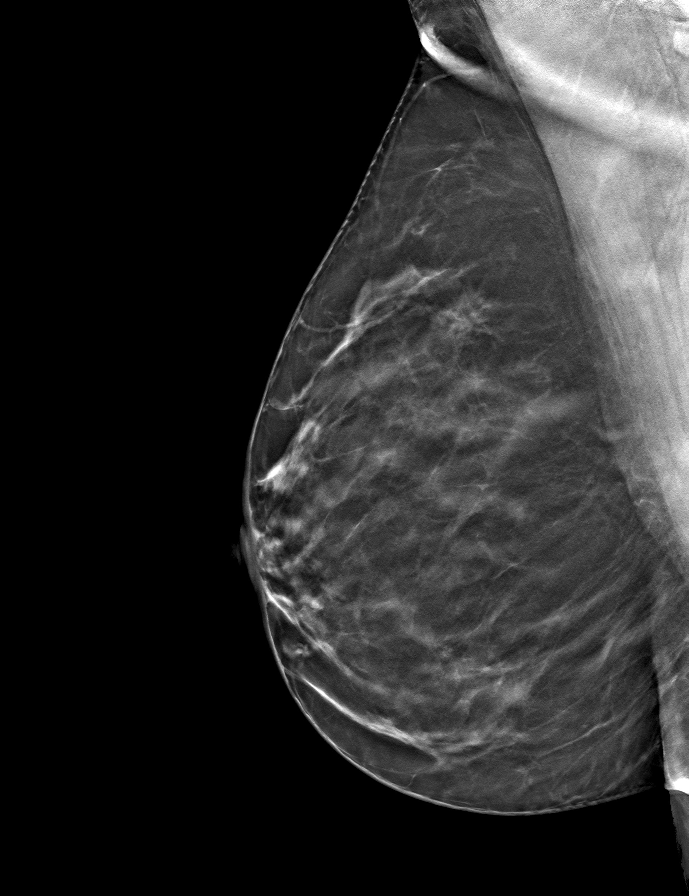

[R CC tomo · tomo slice 33/66.0]
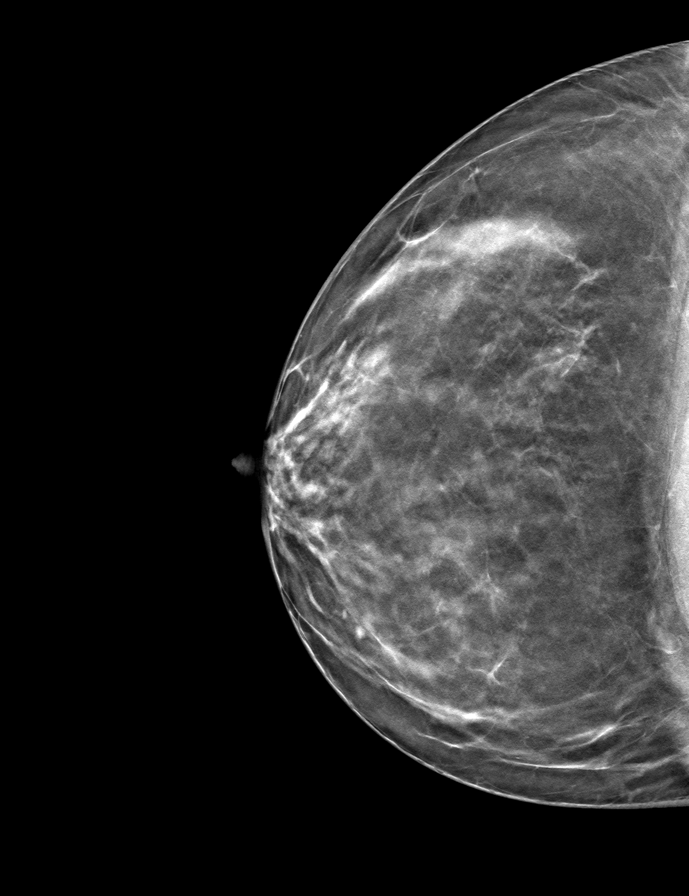

[9 of 24 positions shown; findings below may reference images not displayed]

ACR Breast Density Category b: There are scattered areas of
fibroglandular density.
FINDINGS: There are no findings suspicious for malignancy. Images were
processed with CAD.
IMPRESSION: No mammographic evidence of malignancy. A result letter of this
screening mammogram will be mailed directly to the patient.

RECOMMENDATION:
Screening mammogram in one year. (Code:[id number])

BI-RADS CATEGORY  1: Negative.
